# Patient Record
Sex: Male | Born: 1985 | Race: White | Hispanic: No | Marital: Single
Health system: Southern US, Community
[De-identification: ages and names within clinical notes are randomized; demographics above are authoritative.]

---

## 2014-02-06 ENCOUNTER — Encounter: Payer: Self-pay | Admitting: *Deleted

## 2014-08-26 ENCOUNTER — Emergency Department (HOSPITAL_COMMUNITY)
Admission: EM | Admit: 2014-08-26 | Discharge: 2014-08-26 | Disposition: A | Discharge status: Home / Self Care | Payer: BC Managed Care – PPO | Attending: Emergency Medicine | Admitting: Emergency Medicine

## 2014-08-26 ENCOUNTER — Encounter (HOSPITAL_COMMUNITY): Payer: Self-pay | Admitting: Emergency Medicine

## 2014-08-26 ENCOUNTER — Emergency Department (HOSPITAL_COMMUNITY): Payer: BC Managed Care – PPO

## 2014-08-26 DIAGNOSIS — Y998 Other external cause status: Secondary | ICD-10-CM | POA: Insufficient documentation

## 2014-08-26 DIAGNOSIS — Y9389 Activity, other specified: Secondary | ICD-10-CM | POA: Insufficient documentation

## 2014-08-26 DIAGNOSIS — S3992XA Unspecified injury of lower back, initial encounter: Secondary | ICD-10-CM | POA: Diagnosis present

## 2014-08-26 DIAGNOSIS — Y9289 Other specified places as the place of occurrence of the external cause: Secondary | ICD-10-CM | POA: Diagnosis not present

## 2014-08-26 DIAGNOSIS — R52 Pain, unspecified: Secondary | ICD-10-CM

## 2014-08-26 DIAGNOSIS — S39012A Strain of muscle, fascia and tendon of lower back, initial encounter: Secondary | ICD-10-CM | POA: Diagnosis not present

## 2014-08-26 DIAGNOSIS — X58XXXA Exposure to other specified factors, initial encounter: Secondary | ICD-10-CM | POA: Diagnosis not present

## 2014-08-26 MED ORDER — HYDROCODONE-ACETAMINOPHEN 5-325 MG PO TABS: 1.0000 | ORAL_TABLET | ORAL | Status: AC | PRN

## 2014-08-26 MED ORDER — CYCLOBENZAPRINE HCL 10 MG PO TABS: 10.0000 mg | ORAL_TABLET | Freq: Two times a day (BID) | ORAL | Status: AC | PRN

## 2014-08-26 MED ORDER — HYDROMORPHONE HCL 1 MG/ML IJ SOLN
1.0000 mg | Freq: Once | INTRAMUSCULAR | Status: AC
  Administered 2014-08-26: 1 mg via INTRAMUSCULAR
  Filled 2014-08-26: qty 1

## 2014-08-26 NOTE — ED Notes (Signed)
Bed: WA07 Expected date:  Expected time:  Means of arrival:  Comments: EMS/28 yo male with lower back pain after stepping down off step

## 2014-08-26 NOTE — ED Provider Notes (Signed)
CSN: [id number]     Arrival date & time 08/26/14  2126 History   First MD Initiated Contact with Patient 08/26/14 2129     Chief Complaint  Patient presents with  . Back Pain    HPI Patient presented to the emergency room after injuring his back this evening. Patient went outside to smoke a cigarette when he stepped off a step. The patient suddenly developed sharp pain in his  midline lower back. Pain did not radiate down his legs.  He denies any numbness or weakness.no difficulty urinating.  no abdominal pain or dysuria. However, trying to stand and any movement causes severe pain in the lower back. he has no history of previous injuries. He has no history of chronic back trouble. History reviewed. No pertinent past medical history. No past surgical history on file. History reviewed. No pertinent family history. History  Substance Use Topics  . Smoking status: Not on file  . Smokeless tobacco: Not on file  . Alcohol Use: Not on file    Review of Systems  All other systems reviewed and are negative.     Allergies  Review of patient's allergies indicates no known allergies.  Home Medications   Prior to Admission medications   Medication Sig Start Date End Date Taking? Authorizing Provider  ibuprofen (ADVIL,MOTRIN) 200 MG tablet Take 800 mg by mouth once.   Yes Historical Provider, MD  cyclobenzaprine (FLEXERIL) 10 MG tablet Take 1 tablet (10 mg total) by mouth 2 (two) times daily as needed for muscle spasms. 08/26/14   Linwood DibblesJon Ailey Wessling, MD  HYDROcodone-acetaminophen (NORCO/VICODIN) 5-325 MG per tablet Take 1-2 tablets by mouth every 4 (four) hours as needed. 08/26/14   Linwood DibblesJon Carlei Huang, MD   Pulse 57  Temp(Src) 98.4 F (36.9 C) (Oral)  Resp 20  SpO2 97% Physical Exam  Constitutional: He appears well-developed and well-nourished. No distress.  HENT:  Head: Normocephalic and atraumatic.  Right Ear: External ear normal.  Left Ear: External ear normal.  Eyes: Conjunctivae are normal. Right  eye exhibits no discharge. Left eye exhibits no discharge. No scleral icterus.  Neck: Neck supple. No tracheal deviation present.  Cardiovascular: Normal rate, regular rhythm and intact distal pulses.   Pulmonary/Chest: Effort normal and breath sounds normal. No stridor. No respiratory distress. He has no wheezes. He has no rales.  Abdominal: Soft. Bowel sounds are normal. He exhibits no distension. There is no tenderness. There is no rebound and no guarding.  Musculoskeletal: He exhibits no edema.       Lumbar back: He exhibits tenderness. He exhibits no bony tenderness, no swelling and no edema.  Equal strength bilateral lower extremities, normal plantarflexion and dorsiflexion, normal sensation to light touch  Neurological: He is alert. He has normal strength. No cranial nerve deficit (no facial droop, extraocular movements intact, no slurred speech) or sensory deficit. He exhibits normal muscle tone. He displays no seizure activity. Coordination normal.  Skin: Skin is warm and dry. No rash noted.  Psychiatric: He has a normal mood and affect.  Nursing note and vitals reviewed.   ED Course  Procedures (including critical care time) Labs Review Labs Reviewed - No data to display  Imaging Review Dg Lumbar Spine Complete  08/26/2014   CLINICAL DATA:  Acute onset right back pain. Initial encounter. No known injury.  EXAM: LUMBAR SPINE - COMPLETE 4+ VIEW  COMPARISON:  None.  FINDINGS: There is no evidence of lumbar spine fracture. Alignment is normal. Intervertebral disc spaces are maintained.  IMPRESSION: Negative exam.   Electronically Signed   By: Drusilla Kannerhomas  Dalessio M.D.   On: 08/26/2014 22:20      MDM   Final diagnoses:  Lumbar strain, initial encounter   No sign of acute neurological or vascular emergency associated with pt's back pain.   At this time there does not appear to be any evidence of an acute emergency medical condition and the patient appears stable for discharge with  appropriate outpatient follow up.     Linwood DibblesJon Jonta Gastineau, MD 08/26/14 [phone]

## 2014-08-26 NOTE — ED Notes (Signed)
Pt states he went out to smoke tonight, stepped off a step and felt excruciating pain in his lower back. No radiation. Alert and oriented.

## 2014-08-26 NOTE — Discharge Instructions (Signed)
Back Pain, Adult Low back pain is very common. About 1 in 5 people have back pain.The cause of low back pain is rarely dangerous. The pain often gets better over time.About half of people with a sudden onset of back pain feel better in just 2 weeks. About 8 in 10 people feel better by 6 weeks.  CAUSES Some common causes of back pain include:  Strain of the muscles or ligaments supporting the spine.  Wear and tear (degeneration) of the spinal discs.  Arthritis.  Direct injury to the back. DIAGNOSIS Most of the time, the direct cause of low back pain is not known.However, back pain can be treated effectively even when the exact cause of the pain is unknown.Answering your caregiver's questions about your overall health and symptoms is one of the most accurate ways to make sure the cause of your pain is not dangerous. If your caregiver needs more information, he or she may order lab work or imaging tests (X-rays or MRIs).However, even if imaging tests show changes in your back, this usually does not require surgery. HOME CARE INSTRUCTIONS For many people, back pain returns.Since low back pain is rarely dangerous, it is often a condition that people can learn to manageon their own.   Remain active. It is stressful on the back to sit or stand in one place. Do not sit, drive, or stand in one place for more than 30 minutes at a time. Take short walks on level surfaces as soon as pain allows.Try to increase the length of time you walk each day.  Do not stay in bed.Resting more than 1 or 2 days can delay your recovery.  Do not avoid exercise or work.Your body is made to move.It is not dangerous to be active, even though your back may hurt.Your back will likely heal faster if you return to being active before your pain is gone.  Pay attention to your body when you bend and lift. Many people have less discomfortwhen lifting if they bend their knees, keep the load close to their bodies,and  avoid twisting. Often, the most comfortable positions are those that put less stress on your recovering back.  Find a comfortable position to sleep. Use a firm mattress and lie on your side with your knees slightly bent. If you lie on your back, put a pillow under your knees.  Only take over-the-counter or prescription medicines as directed by your caregiver. Over-the-counter medicines to reduce pain and inflammation are often the most helpful.Your caregiver may prescribe muscle relaxant drugs.These medicines help dull your pain so you can more quickly return to your normal activities and healthy exercise.  Put ice on the injured area.  Put ice in a plastic bag.  Place a towel between your skin and the bag.  Leave the ice on for 15-20 minutes, 03-04 times a day for the first 2 to 3 days. After that, ice and heat may be alternated to reduce pain and spasms.  Ask your caregiver about trying back exercises and gentle massage. This may be of some benefit.  Avoid feeling anxious or stressed.Stress increases muscle tension and can worsen back pain.It is important to recognize when you are anxious or stressed and learn ways to manage it.Exercise is a great option. SEEK MEDICAL CARE IF:  You have pain that is not relieved with rest or medicine.  You have pain that does not improve in 1 week.  You have new symptoms.  You are generally not feeling well. SEEK   IMMEDIATE MEDICAL CARE IF:   You have pain that radiates from your back into your legs.  You develop new bowel or bladder control problems.  You have unusual weakness or numbness in your arms or legs.  You develop nausea or vomiting.  You develop abdominal pain.  You feel faint. Document Released: 10/05/2005 Document Revised: 04/05/2012 Document Reviewed: 02/06/2014 [employer] Patient Information 2015 [employer]. This information is not intended to replace advice given to you by your health care provider. Make sure you  discuss any questions you have with your health care provider.  

## 2014-08-26 NOTE — ED Notes (Signed)
Patient transported to X-ray 

## 2015-11-25 IMAGING — CR DG LUMBAR SPINE COMPLETE 4+V
5 series · 5 of 5 positions shown · non-contrast
Comparison: None.

CLINICAL DATA: Acute onset right back pain. Initial encounter. No
known injury.

EXAM:
LUMBAR SPINE - COMPLETE 4+ VIEW

[t lumbar spine ap]
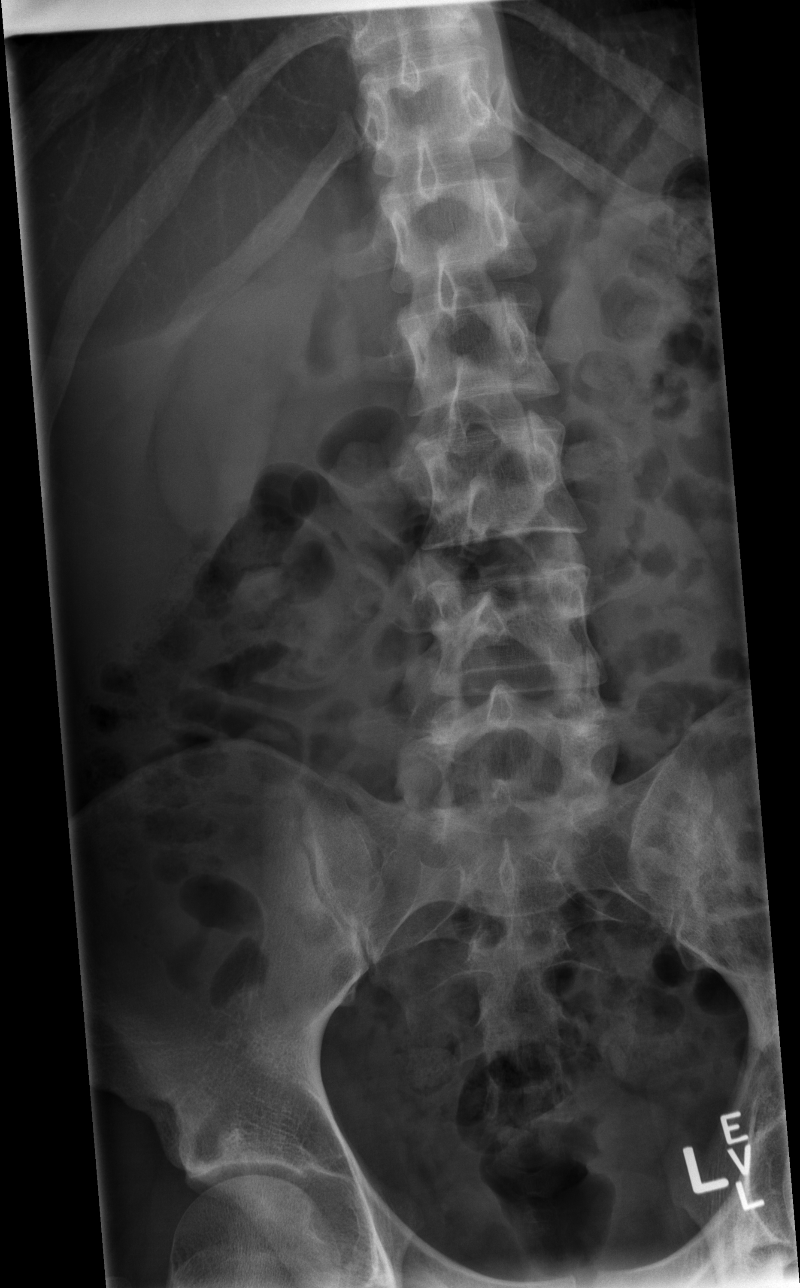

[t lumbar spine obl (1 of 2)]
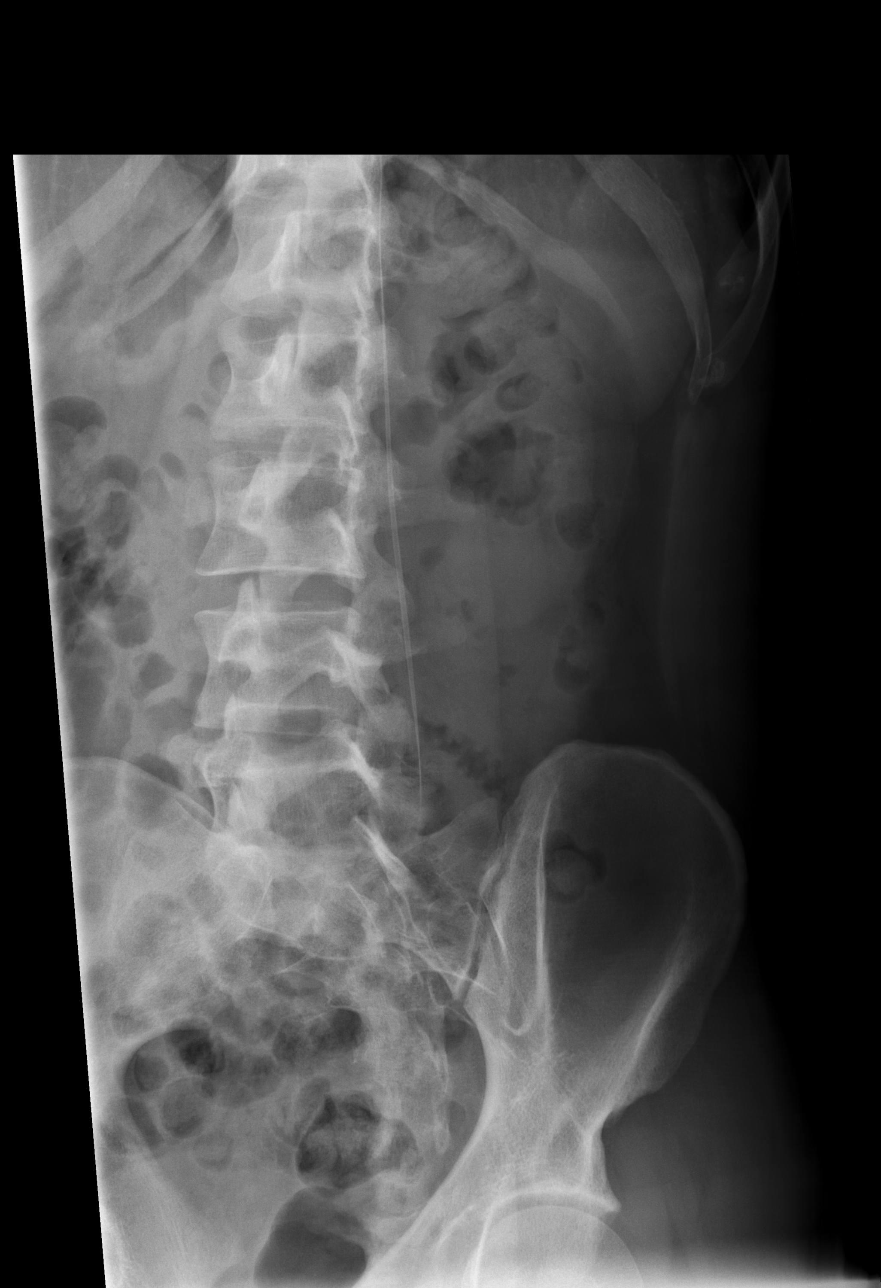

[t lumbar spine obl (2 of 2)]
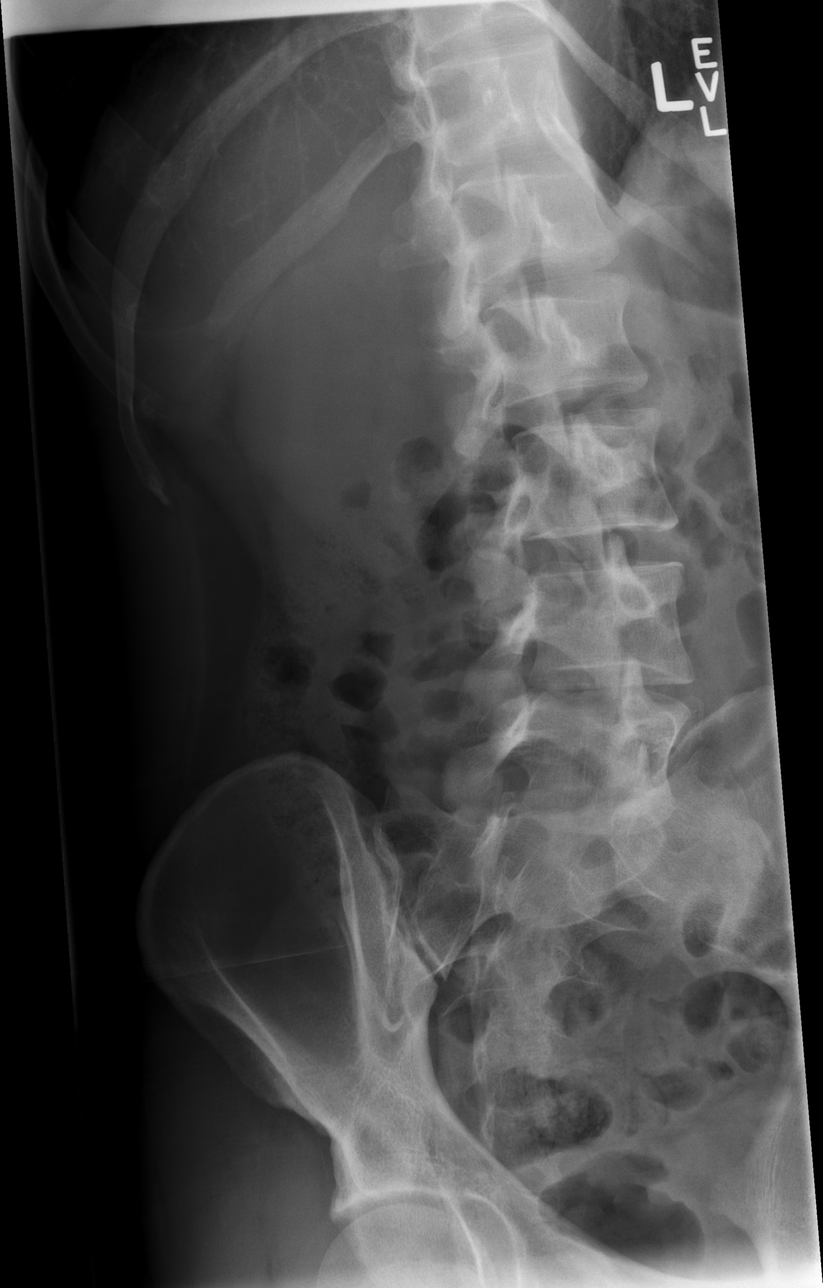

[t lumbar spine lat]
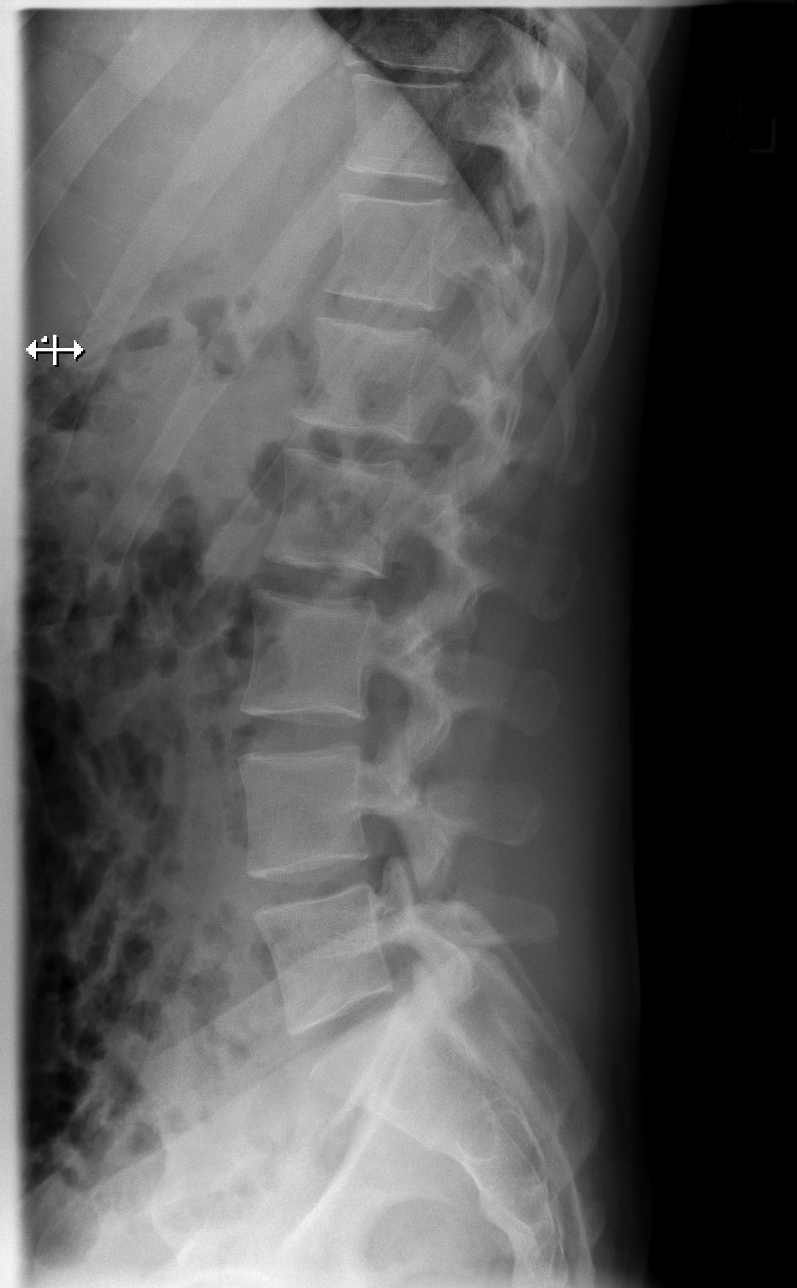

[t lumbar l-5 s-1 spot]
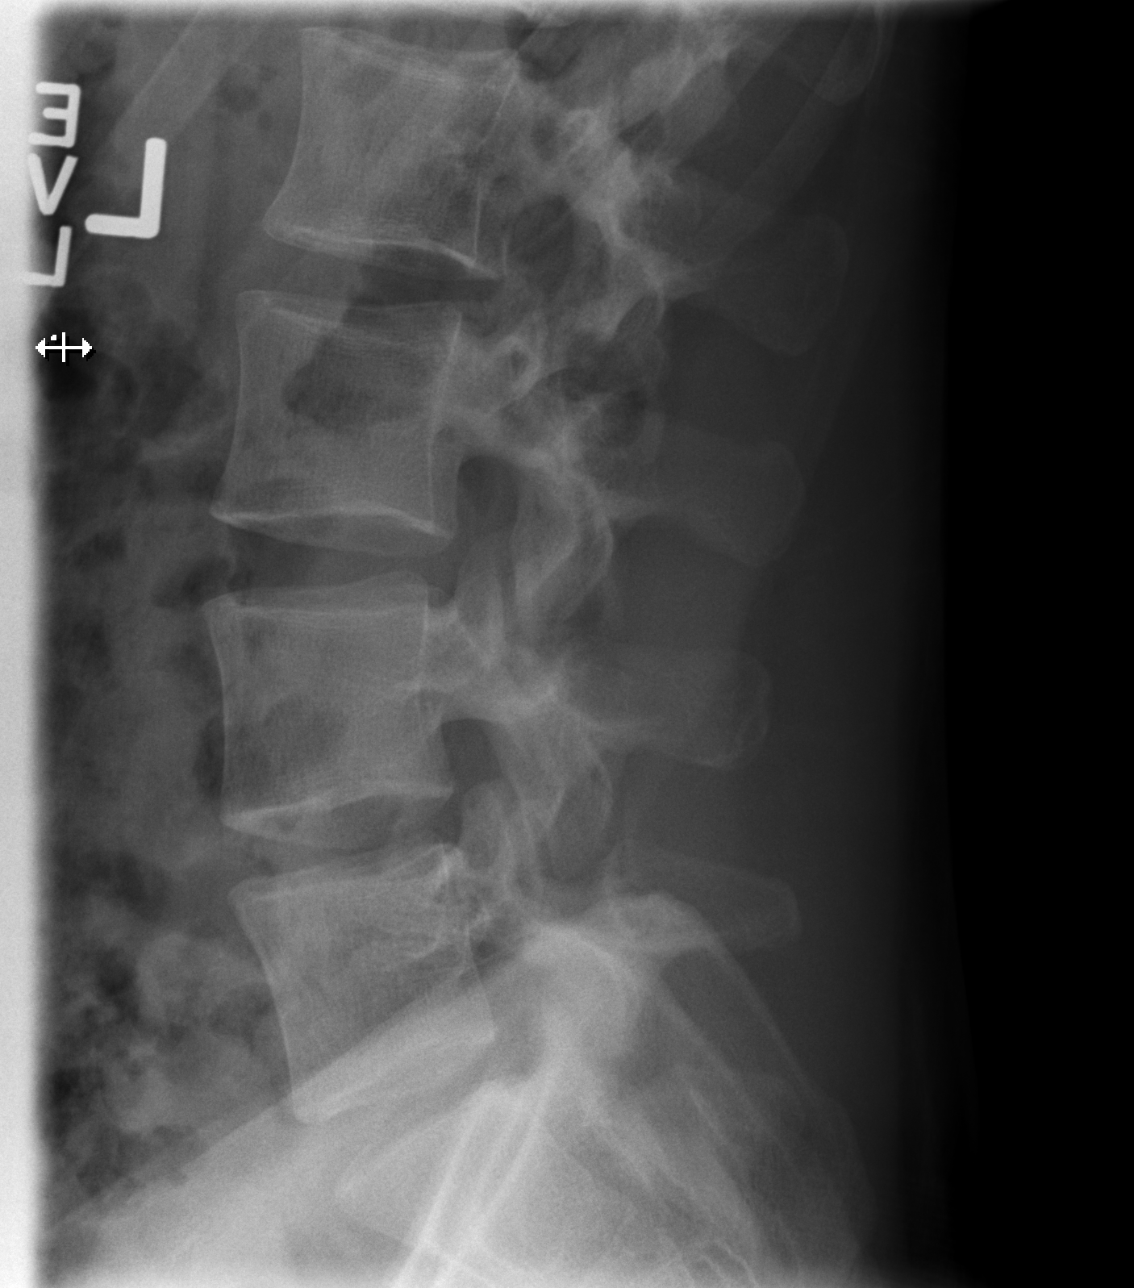

[5 of 5 positions shown; findings below may reference images not displayed]

FINDINGS: There is no evidence of lumbar spine fracture. Alignment is normal.
Intervertebral disc spaces are maintained.
IMPRESSION: Negative exam.
